# Patient Record
Sex: Female | Born: 1990 | Race: White | Hispanic: No | Marital: Single | Smoking: Never smoker
Health system: Southern US, Community
[De-identification: ages and names within clinical notes are randomized; demographics above are authoritative.]

## PROBLEM LIST (undated history)

## (undated) ENCOUNTER — Inpatient Hospital Stay (HOSPITAL_COMMUNITY): Payer: Self-pay

## (undated) DIAGNOSIS — O9A213 Injury, poisoning and certain other consequences of external causes complicating pregnancy, third trimester: Secondary | ICD-10-CM

## (undated) DIAGNOSIS — N200 Calculus of kidney: Secondary | ICD-10-CM

## (undated) HISTORY — PX: NO PAST SURGERIES: SHX2092

---
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
## 2011-05-12 LAB — URINE MICROSCOPIC-ADD ON

## 2011-05-12 LAB — URINALYSIS, ROUTINE W REFLEX MICROSCOPIC
Glucose, UA: NEGATIVE mg/dL
Ketones, ur: NEGATIVE mg/dL
Leukocytes, UA: NEGATIVE
pH: 8 (ref 5.0–8.0)

## 2011-05-12 LAB — URINE CULTURE

## 2015-05-14 LAB — OB RESULTS CONSOLE ANTIBODY SCREEN: ANTIBODY SCREEN: NEGATIVE

## 2015-05-14 LAB — OB RESULTS CONSOLE ABO/RH: RH Type: POSITIVE

## 2015-05-14 LAB — OB RESULTS CONSOLE RUBELLA ANTIBODY, IGM: RUBELLA: IMMUNE

## 2015-05-14 LAB — OB RESULTS CONSOLE HEPATITIS B SURFACE ANTIGEN: Hepatitis B Surface Ag: NEGATIVE

## 2015-05-14 LAB — OB RESULTS CONSOLE HIV ANTIBODY (ROUTINE TESTING): HIV: NONREACTIVE

## 2015-05-24 LAB — OB RESULTS CONSOLE GC/CHLAMYDIA
CHLAMYDIA, DNA PROBE: NEGATIVE
GC PROBE AMP, GENITAL: NEGATIVE

## 2015-08-08 NOTE — L&D Delivery Note (Signed)
Delivery Note At 11:14 AM a viable female was delivered via Vaginal, Spontaneous Delivery (Presentation: Left Occiput Anterior).  APGAR: 9, 9; weight  .   Placenta status: Intact, Spontaneous.  Cord: 3 vessels with the following complications: None.  Cord pH: not indicated  Anesthesia: Epidural  Episiotomy:  none Lacerations: 2nd degree, L sulcal Suture Repair: 2.0 vicryl rapide Est. Blood Loss (mL):  400  Mom to postpartum.  Baby to Couplet care / Skin to Skin.  [name] A. 12/09/2015, 11:31 AM

## 2015-09-21 ENCOUNTER — Encounter (HOSPITAL_COMMUNITY): Payer: Self-pay | Admitting: Obstetrics and Gynecology

## 2015-09-21 ENCOUNTER — Inpatient Hospital Stay (HOSPITAL_COMMUNITY): Payer: BLUE CROSS/BLUE SHIELD

## 2015-09-21 ENCOUNTER — Inpatient Hospital Stay (HOSPITAL_COMMUNITY)
Admission: AD | Admit: 2015-09-21 | Discharge: 2015-09-21 | Disposition: A | Discharge status: Home / Self Care | Payer: BLUE CROSS/BLUE SHIELD | Source: Ambulatory Visit | Attending: Obstetrics | Admitting: Obstetrics

## 2015-09-21 DIAGNOSIS — Z3A28 28 weeks gestation of pregnancy: Secondary | ICD-10-CM | POA: Insufficient documentation

## 2015-09-21 DIAGNOSIS — O9A213 Injury, poisoning and certain other consequences of external causes complicating pregnancy, third trimester: Secondary | ICD-10-CM

## 2015-09-21 DIAGNOSIS — O26893 Other specified pregnancy related conditions, third trimester: Secondary | ICD-10-CM | POA: Insufficient documentation

## 2015-09-21 DIAGNOSIS — O9A219 Injury, poisoning and certain other consequences of external causes complicating pregnancy, unspecified trimester: Secondary | ICD-10-CM

## 2015-09-21 HISTORY — DX: Injury, poisoning and certain other consequences of external causes complicating pregnancy, third trimester: O9A.213

## 2015-09-21 LAB — KLEIHAUER-BETKE STAIN
# Vials RhIg: 1
FETAL CELLS %: 0 %
QUANTITATION FETAL HEMOGLOBIN: 0 mL

## 2015-09-21 MED ORDER — LACTATED RINGERS IV BOLUS (SEPSIS): 1000.0000 mL | Freq: Once | INTRAVENOUS | Status: DC

## 2015-09-21 MED ORDER — LACTATED RINGERS IV SOLN: INTRAVENOUS | Status: DC
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
## 2015-09-21 NOTE — MAU Note (Signed)
Urine sent to Lab
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
## 2015-09-22 LAB — OB RESULTS CONSOLE RPR: RPR: NONREACTIVE

## 2015-11-16 LAB — OB RESULTS CONSOLE GBS: GBS: NEGATIVE

## 2015-12-08 ENCOUNTER — Encounter (HOSPITAL_COMMUNITY): Payer: Self-pay | Admitting: *Deleted

## 2015-12-08 ENCOUNTER — Inpatient Hospital Stay (HOSPITAL_COMMUNITY)
Admission: AD | Admit: 2015-12-08 | Discharge: 2015-12-11 | DRG: 775 | Disposition: A | Discharge status: Home / Self Care | Payer: BLUE CROSS/BLUE SHIELD | Source: Ambulatory Visit | Attending: Obstetrics | Admitting: Obstetrics

## 2015-12-08 DIAGNOSIS — Z87442 Personal history of urinary calculi: Secondary | ICD-10-CM

## 2015-12-08 DIAGNOSIS — Z3A39 39 weeks gestation of pregnancy: Secondary | ICD-10-CM | POA: Diagnosis not present

## 2015-12-08 HISTORY — DX: Calculus of kidney: N20.0

## 2015-12-08 LAB — CBC
HEMATOCRIT: 33.3 % — AB (ref 36.0–46.0)
HEMOGLOBIN: 11.7 g/dL — AB (ref 12.0–15.0)
MCH: 31.3 pg (ref 26.0–34.0)
MCHC: 35.1 g/dL (ref 30.0–36.0)
MCV: 89 fL (ref 78.0–100.0)
Platelets: 251 10*3/uL (ref 150–400)
RBC: 3.74 MIL/uL — ABNORMAL LOW (ref 3.87–5.11)
RDW: 12.6 % (ref 11.5–15.5)
WBC: 19.9 10*3/uL — ABNORMAL HIGH (ref 4.0–10.5)

## 2015-12-08 LAB — TYPE AND SCREEN
ABO/RH(D): A POS
ANTIBODY SCREEN: NEGATIVE

## 2015-12-08 MED ORDER — OXYCODONE-ACETAMINOPHEN 5-325 MG PO TABS: 2.0000 | ORAL_TABLET | ORAL | Status: DC | PRN

## 2015-12-08 MED ORDER — FLEET ENEMA 7-19 GM/118ML RE ENEM: 1.0000 | ENEMA | RECTAL | Status: DC | PRN

## 2015-12-08 MED ORDER — OXYCODONE-ACETAMINOPHEN 5-325 MG PO TABS: 1.0000 | ORAL_TABLET | ORAL | Status: DC | PRN

## 2015-12-08 MED ORDER — LACTATED RINGERS IV SOLN
INTRAVENOUS | Status: DC
  Administered 2015-12-08 – 2015-12-09 (×3): via INTRAVENOUS

## 2015-12-08 MED ORDER — ACETAMINOPHEN 325 MG PO TABS: 650.0000 mg | ORAL_TABLET | ORAL | Status: DC | PRN

## 2015-12-08 MED ORDER — OXYTOCIN BOLUS FROM INFUSION: 500.0000 mL | INTRAVENOUS | Status: DC

## 2015-12-08 MED ORDER — OXYTOCIN 10 UNIT/ML IJ SOLN
2.5000 [IU]/h | INTRAVENOUS | Status: DC
  Filled 2015-12-08: qty 4

## 2015-12-08 MED ORDER — LACTATED RINGERS IV SOLN
500.0000 mL | INTRAVENOUS | Status: DC | PRN
  Administered 2015-12-09: 500 mL via INTRAVENOUS
  Administered 2015-12-09: 1000 mL via INTRAVENOUS
  Administered 2015-12-09: 500 mL via INTRAVENOUS

## 2015-12-08 MED ORDER — ONDANSETRON HCL 4 MG/2ML IJ SOLN: 4.0000 mg | Freq: Four times a day (QID) | INTRAMUSCULAR | Status: DC | PRN

## 2015-12-08 MED ORDER — LIDOCAINE HCL (PF) 1 % IJ SOLN
30.0000 mL | INTRAMUSCULAR | Status: DC | PRN
  Filled 2015-12-08: qty 30

## 2015-12-08 MED ORDER — CITRIC ACID-SODIUM CITRATE 334-500 MG/5ML PO SOLN
30.0000 mL | ORAL | Status: DC | PRN
  Filled 2015-12-08: qty 15
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
## 2015-12-09 ENCOUNTER — Inpatient Hospital Stay (HOSPITAL_COMMUNITY): Payer: BLUE CROSS/BLUE SHIELD | Admitting: Anesthesiology

## 2015-12-09 ENCOUNTER — Encounter (HOSPITAL_COMMUNITY): Payer: Self-pay | Admitting: Anesthesiology

## 2015-12-09 LAB — ABO/RH: ABO/RH(D): A POS

## 2015-12-09 LAB — RPR: RPR Ser Ql: NONREACTIVE

## 2015-12-09 MED ORDER — ACETAMINOPHEN 325 MG PO TABS
650.0000 mg | ORAL_TABLET | ORAL | Status: DC | PRN
  Filled 2015-12-09: qty 2

## 2015-12-09 MED ORDER — GENTAMICIN SULFATE 40 MG/ML IJ SOLN
7.0000 mg/kg | Freq: Once | INTRAMUSCULAR | Status: AC
  Administered 2015-12-09: 400 mg via INTRAVENOUS
  Filled 2015-12-09: qty 10

## 2015-12-09 MED ORDER — IBUPROFEN 100 MG/5ML PO SUSP
600.0000 mg | Freq: Four times a day (QID) | ORAL | Status: DC
  Administered 2015-12-09 – 2015-12-11 (×7): 600 mg via ORAL
  Filled 2015-12-09 (×9): qty 30

## 2015-12-09 MED ORDER — TERBUTALINE SULFATE 1 MG/ML IJ SOLN
0.2500 mg | Freq: Once | INTRAMUSCULAR | Status: DC | PRN
Start: 2015-12-09 — End: 2015-12-09
  Filled 2015-12-09: qty 1

## 2015-12-09 MED ORDER — ZOLPIDEM TARTRATE 5 MG PO TABS: 5.0000 mg | ORAL_TABLET | Freq: Every evening | ORAL | Status: DC | PRN

## 2015-12-09 MED ORDER — DIPHENHYDRAMINE HCL 50 MG/ML IJ SOLN: 12.5000 mg | INTRAMUSCULAR | Status: DC | PRN

## 2015-12-09 MED ORDER — ONDANSETRON HCL 4 MG/2ML IJ SOLN: 4.0000 mg | INTRAMUSCULAR | Status: DC | PRN

## 2015-12-09 MED ORDER — PHENYLEPHRINE 40 MCG/ML (10ML) SYRINGE FOR IV PUSH (FOR BLOOD PRESSURE SUPPORT)
80.0000 ug | PREFILLED_SYRINGE | INTRAVENOUS | Status: DC | PRN
  Filled 2015-12-09: qty 5
  Filled 2015-12-09 (×2): qty 10

## 2015-12-09 MED ORDER — COCONUT OIL OIL: 1.0000 "application " | TOPICAL_OIL | Status: DC | PRN

## 2015-12-09 MED ORDER — LIDOCAINE HCL (PF) 1 % IJ SOLN
INTRAMUSCULAR | Status: DC | PRN
  Administered 2015-12-09 (×2): 4 mL via EPIDURAL

## 2015-12-09 MED ORDER — WITCH HAZEL-GLYCERIN EX PADS: 1.0000 "application " | MEDICATED_PAD | CUTANEOUS | Status: DC | PRN

## 2015-12-09 MED ORDER — PRENATAL MULTIVITAMIN CH
1.0000 | ORAL_TABLET | Freq: Every day | ORAL | Status: DC
  Filled 2015-12-09: qty 1

## 2015-12-09 MED ORDER — TETANUS-DIPHTH-ACELL PERTUSSIS 5-2.5-18.5 LF-MCG/0.5 IM SUSP: 0.5000 mL | Freq: Once | INTRAMUSCULAR | Status: DC

## 2015-12-09 MED ORDER — EPHEDRINE 5 MG/ML INJ
10.0000 mg | INTRAVENOUS | Status: DC | PRN
  Filled 2015-12-09: qty 2

## 2015-12-09 MED ORDER — DIBUCAINE 1 % RE OINT: 1.0000 "application " | TOPICAL_OINTMENT | RECTAL | Status: DC | PRN

## 2015-12-09 MED ORDER — IBUPROFEN 600 MG PO TABS
600.0000 mg | ORAL_TABLET | Freq: Four times a day (QID) | ORAL | Status: DC
  Filled 2015-12-09: qty 1

## 2015-12-09 MED ORDER — BENZOCAINE-MENTHOL 20-0.5 % EX AERO
1.0000 "application " | INHALATION_SPRAY | CUTANEOUS | Status: DC | PRN
  Filled 2015-12-09: qty 56

## 2015-12-09 MED ORDER — PHENYLEPHRINE 40 MCG/ML (10ML) SYRINGE FOR IV PUSH (FOR BLOOD PRESSURE SUPPORT)
80.0000 ug | PREFILLED_SYRINGE | INTRAVENOUS | Status: AC | PRN
  Administered 2015-12-09 (×3): 80 ug via INTRAVENOUS

## 2015-12-09 MED ORDER — LACTATED RINGERS IV SOLN
500.0000 mL | Freq: Once | INTRAVENOUS | Status: AC
  Administered 2015-12-09: 500 mL via INTRAVENOUS

## 2015-12-09 MED ORDER — DIPHENHYDRAMINE HCL 25 MG PO CAPS: 25.0000 mg | ORAL_CAPSULE | Freq: Four times a day (QID) | ORAL | Status: DC | PRN

## 2015-12-09 MED ORDER — CLINDAMYCIN PHOSPHATE 900 MG/50ML IV SOLN
900.0000 mg | Freq: Three times a day (TID) | INTRAVENOUS | Status: AC
  Administered 2015-12-09 – 2015-12-10 (×3): 900 mg via INTRAVENOUS
  Filled 2015-12-09 (×4): qty 50

## 2015-12-09 MED ORDER — FENTANYL 2.5 MCG/ML BUPIVACAINE 1/10 % EPIDURAL INFUSION (WH - ANES)
14.0000 mL/h | INTRAMUSCULAR | Status: DC | PRN
  Administered 2015-12-09 (×2): 14 mL/h via EPIDURAL
  Filled 2015-12-09 (×2): qty 125

## 2015-12-09 MED ORDER — OXYTOCIN 10 UNIT/ML IJ SOLN
1.0000 m[IU]/min | INTRAVENOUS | Status: DC
  Administered 2015-12-09: 2 m[IU]/min via INTRAVENOUS

## 2015-12-09 MED ORDER — OXYTOCIN 10 UNIT/ML IJ SOLN: 1.0000 m[IU]/min | INTRAVENOUS | Status: DC

## 2015-12-09 MED ORDER — ONDANSETRON HCL 4 MG PO TABS: 4.0000 mg | ORAL_TABLET | ORAL | Status: DC | PRN

## 2015-12-09 MED ORDER — SENNOSIDES-DOCUSATE SODIUM 8.6-50 MG PO TABS
2.0000 | ORAL_TABLET | ORAL | Status: DC
  Filled 2015-12-09 (×2): qty 2

## 2015-12-09 MED ORDER — SIMETHICONE 80 MG PO CHEW
80.0000 mg | CHEWABLE_TABLET | ORAL | Status: DC | PRN
Start: 2015-12-09 — End: 2015-12-11

## 2015-12-09 NOTE — Anesthesia Preprocedure Evaluation (Signed)
Anesthesia Evaluation  Patient identified by MRN, date of birth, ID band Patient awake    Reviewed: Allergy & Precautions, NPO status , Patient's Chart, lab work & pertinent test results  Airway Mallampati: II  TM Distance: >3 FB Neck ROM: Full    Dental no notable dental hx. (+) Teeth Intact   Pulmonary neg pulmonary ROS,    Pulmonary exam normal breath sounds clear to auscultation       Cardiovascular negative cardio ROS Normal cardiovascular exam Rhythm:Regular Rate:Normal     Neuro/Psych negative neurological ROS  negative psych ROS   GI/Hepatic   Endo/Other  negative endocrine ROS  Renal/GU Renal diseaseHx/o renal calculi  negative genitourinary   Musculoskeletal negative musculoskeletal ROS (+)   Abdominal   Peds  Hematology  (+) anemia ,   Anesthesia Other Findings   Reproductive/Obstetrics (+) Pregnancy                             Anesthesia Physical Anesthesia Plan  ASA: II  Anesthesia Plan: Epidural   Post-op Pain Management:    Induction:   Airway Management Planned: Natural Airway  Additional Equipment:   Intra-op Plan:   Post-operative Plan:   Informed Consent: I have reviewed the patients History and Physical, chart, labs and discussed the procedure including the risks, benefits and alternatives for the proposed anesthesia with the patient or authorized representative who has indicated his/her understanding and acceptance.     Plan Discussed with: Anesthesiologist  Anesthesia Plan Comments:         Anesthesia Quick Evaluation

## 2015-12-09 NOTE — Lactation Note (Addendum)
This note was copied from a baby's chart. Lactation Consultation Note  Patient Name: Felicia Wood WGNFA'OToday's Date: 12/09/2015 Reason for consult: Initial assessment Baby at 7 hr of life and RN requested help because baby was not latching. Mom has short shaft nipples with firm breast. Mom does have creases in the center of each nipple that FOB reports have always been there. Applied #16 NS and baby was able to go on well after 2 attempts. After 10 minutes mom reported pain. Instructed mom on how to take baby off the breast. Colostrum was noted in the NS. Discussed baby behavior, feeding frequency, baby belly size, voids, wt loss, breast changes, and nipple care. Demonstrated manual expression, colostrum noted bilaterally, spoon in room. Given lactation handouts. Aware of OP services and support group. Mom will offer the breast on demand 8+/24 hr and use the [place] for 5-10 minutes after feeding using the NS. She will try to latch baby without the NS but will continue to use it as needed. She will offer any milk she might get pumping back to the baby with the spoon.        Maternal Data Formula Feeding for Exclusion: No Has patient been taught Hand Expression?: Yes Does the patient have breastfeeding experience prior to this delivery?: No  Feeding Feeding Type: Breast Fed Length of feed: 10 min  LATCH Score/Interventions Latch: Repeated attempts needed to sustain latch, nipple held in mouth throughout feeding, stimulation needed to elicit sucking reflex. Intervention(s): Adjust position;Assist with latch  Audible Swallowing: Spontaneous and intermittent Intervention(s): Hand expression;Skin to skin  Type of Nipple: Everted at rest and after stimulation  Comfort (Breast/Nipple): Soft / non-tender     Hold (Positioning): Full assist, staff holds infant at breast Intervention(s): Support Pillows;Position options  LATCH Score: 7  Lactation Tools Discussed/Used Tools: Nipple  Shields Nipple shield size: 16 WIC Program: No Pump Review: Setup, frequency, and cleaning;Milk Storage Initiated by:: ES Date initiated:: 12/09/15   Consult Status Consult Status: Follow-up Date: 12/10/15 Follow-up type: In-patient    [name] 12/09/2015, 6:29 PM

## 2015-12-09 NOTE — Anesthesia Procedure Notes (Signed)
Epidural Patient location during procedure: OB Start time: 12/09/2015 12:57 AM  Staffing Anesthesiologist: Mal AmabileFOSTER, Felder Lebeda Performed by: anesthesiologist   Preanesthetic Checklist Completed: patient identified, site marked, surgical consent, pre-op evaluation, timeout performed, IV checked, risks and benefits discussed and monitors and equipment checked  Epidural Patient position: sitting Prep: site prepped and draped and DuraPrep Patient monitoring: continuous pulse ox and blood pressure Approach: midline Location: L3-L4 Injection technique: LOR air  Needle:  Needle type: Tuohy  Needle gauge: 17 G Needle length: 9 cm and 9 Needle insertion depth: 5 cm cm Catheter type: closed end flexible Catheter size: 19 Gauge Catheter at skin depth: 10 cm Test dose: negative and Other  Assessment Events: blood not aspirated, injection not painful, no injection resistance, negative IV test and no paresthesia  Additional Notes Patient identified. Risks and benefits discussed including failed block, incomplete  Pain control, post dural puncture headache, nerve damage, paralysis, blood pressure Changes, nausea, vomiting, reactions to medications-both toxic and allergic and post Partum back pain. All questions were answered. Patient expressed understanding and wished to proceed. Sterile technique was used throughout procedure. Epidural site was Dressed with sterile barrier dressing. No paresthesias, signs of intravascular injection Or signs of intrathecal spread were encountered.  Patient was more comfortable after the epidural was dosed. Please see RN's note for documentation of vital signs and FHR which are stable.

## 2015-12-09 NOTE — Anesthesia Postprocedure Evaluation (Signed)
Anesthesia Post Note  Patient: Felicia Wood  Procedure(s) Performed: * No procedures listed *  Patient location during evaluation: Mother Baby Anesthesia Type: Epidural Level of consciousness: awake Pain management: pain level controlled Vital Signs Assessment: post-procedure vital signs reviewed and stable Respiratory status: spontaneous breathing Cardiovascular status: stable Postop Assessment: no headache, no backache, epidural receding, patient able to bend at knees, no signs of nausea or vomiting and adequate PO intake Anesthetic complications: no     Last Vitals:  Filed Vitals:   12/09/15 1301 12/09/15 1330  BP: 116/79   Pulse: 92   Temp:  37.9 C  Resp: 18     Last Pain:  Filed Vitals:   12/09/15 1348  PainSc: 0-No pain   Pain Goal: Patients Stated Pain Goal: 6 (12/09/15 0244)               Fanny DanceMULLINS,Layliana Devins

## 2015-12-09 NOTE — Progress Notes (Signed)
S: Doing well, no complaints, pain well controlled with epidural  O: BP 107/52 mmHg  Pulse 67  Temp(Src) 98.3 F (36.8 C) (Oral)  Resp 16  Ht 5\' 2"  (1.575 m)  Wt 68.04 kg (150 lb)  BMI 27.43 kg/m2  SpO2 100%   FHT:  FHR: 145s bpm, variability: moderate,  accelerations:  Present,  decelerations:  Present occasional episodes of prolonged 4-6 min decels to 90s UC:   irregular, every 7 minutes SVE:   Dilation: 5 Effacement (%): 80 Station: 0 Exam by:: Dr. Ernestina PennaFogleman   A / P:  24 y.o.  Obstetric History   G2   P0   T0   P0   A1   TAB0   SAB1   E0   M0   L0    at 38107w3d spontaneous labor, admitted in latent phase due to decel in MAU. slow progress Start pitocin  Fetal Wellbeing:  Category I Pain Control:  Epidural  Anticipated MOD:  NSVD  Felicia Wood A. 12/09/2015, 5:31 AM

## 2015-12-10 NOTE — Lactation Note (Signed)
This note was copied from a baby's chart. Lactation Consultation Note  Patient Name: [name] WUJWJ'XToday's Date: 12/10/2015 Reason for consult: Follow-up assessment  Baby 22 hours old. Mom reports sore nipples and left nipple is cracked. Mom given comfort gels with instructions. Discussed with mom that nursing should not hurt. Enc mom to call for LC to assist with latching baby in football position to achieve a deeper latch. Discussed positioning with pillows and different nursing positions as well. Reviewed normal progression of milk coming to volume, and discussed OP/BFSG and LC phone line assistance after D/C.  Maternal Data    Feeding    LATCH Score/Interventions                      Lactation Tools Discussed/Used Tools: Nipple Shields;Comfort gels Nipple shield size: 16   Consult Status Consult Status: Follow-up Date: 12/11/15 Follow-up type: In-patient    [name], Detta Mellin 12/10/2015, 10:05 AM

## 2015-12-10 NOTE — Lactation Note (Signed)
This note was copied from a baby's chart. Lactation Consultation Note  Patient Name: Felicia Wood ONGEX'BToday's Date: 12/10/2015 Reason for consult: Follow-up assessment  Baby 28 hours old. Mom called out for assistance with latching baby due to mom's left nipple bleeding last night, and both nipples sore. Baby's tongue does not move freely--restricted lift, lateralization and extension over gumline. Enc mom to discuss baby's oral anatomy with pediatrician if nipple pain continues especially after mom's milk comes to volume. Also enc tummy time. Mom aware of OP/BFSG and LC assistance after D/C. Assisted mom to latch baby in cross-cradle position to right breast, but mom states that latch hurts. Refitted mom with #20 NS and mom reported increased comfort. Baby able to maintain latch and nurse for 30 minutes with colostrum present in NS. However, mom reports seeing a spot of blood at right nipple when baby finished nursing. Discussed with mom that NS a temporary device, and discussed methods of moving away from its use. Discussed the need to make sure baby transferring milk through shield, watching baby's weight gain carefully, and providing extra stimulation of breasts with DEBP. Mom has a DEBP at home.  Brought pumping supplies into room and asked patient's bedside nurse, Patty, RN to assist mom with the set up. Enc mom to use comfort gels and discussed with mom and Patty, RN that mom can rest her nipples if too painful to nurse and pump instead. Enc parents to ask for assistance with giving the baby EBM as needed. Discussed assessment and interventions with evening LC, Jana, RN, IBCLC. Maternal Data    Feeding Feeding Type: Breast Fed Length of feed: 30 min  LATCH Score/Interventions Latch: Grasps breast easily, tongue down, lips flanged, rhythmical sucking.  Audible Swallowing: A few with stimulation Intervention(s): Skin to skin;Hand expression  Type of Nipple: Everted at rest and after  stimulation  Comfort (Breast/Nipple): Filling, red/small blisters or bruises, mild/mod discomfort  Problem noted: Mild/Moderate discomfort  Hold (Positioning): No assistance needed to correctly position infant at breast.  LATCH Score: 8  Lactation Tools Discussed/Used Tools: Nipple Shields Nipple shield size: 20   Consult Status Consult Status: Follow-up Date: 12/11/15 Follow-up type: In-patient    [name], [name] 12/10/2015, 4:31 PM

## 2015-12-10 NOTE — Progress Notes (Signed)
Patient ID: Harrietta GuardianKristy L Kapusta, female   DOB: 12-25-1990, 25 y.o.   MRN: [id number] PPD # 1 SVD  S:  Reports feeling well.             Tolerating po/ No nausea or vomiting             Bleeding is light             Pain controlled with ibuprofen (OTC)             Up ad lib / ambulatory / voiding without difficulties    Newborn  Information for the patient's newborn:  Ilda Bassetlexander, Girl Summer [[id number]][[id number]]  female  breast feeding    O:  A & O x 3, in no apparent distress              VS:  Filed Vitals:   12/09/15 1425 12/09/15 1530 12/09/15 2001 12/10/15 0642  BP: 109/53 104/46 107/47 108/55  Pulse: 95 101 84 85  Temp: 101.2 F (38.4 C) 98.2 F (36.8 C) 98.1 F (36.7 C) 98.4 F (36.9 C)  TempSrc: Oral Oral Oral Oral  Resp: 18 18 18 18   Height:      Weight:      SpO2: 96% 98% 98%     LABS:  Recent Labs  12/08/15 2235  WBC 19.9*  HGB 11.7*  HCT 33.3*  PLT 251    Blood type: A POS (05/03 2235)  Rubella: Immune (10/07 0000)   I&O: I/O last 3 completed shifts: In: -  Out: 3850 [Urine:3450; Blood:400]             Lungs: Clear and unlabored  Heart: regular rate and rhythm / no murmurs  Abdomen: soft, non-tender, non-distended             Fundus: firm, non-tender, U-1  Perineum: 2nd degree repair healing well, no edema  Lochia: minimal  Extremities: No edema, no calf pain or tenderness, No Homans    A/P: PPD # 1  24 y.o., [id number]   Principal Problem:   Postpartum care following vaginal delivery (5/4) Active Problems:   Normal labor   Doing well - stable status  Routine post partum orders  Anticipate discharge tomorrow    Raelyn MoraAWSON, Saul Dorsi, M, MSN, CNM 12/10/2015, 2:31 PM

## 2015-12-11 MED ORDER — IBUPROFEN 100 MG/5ML PO SUSP: 600.0000 mg | Freq: Four times a day (QID) | ORAL | Status: AC

## 2015-12-11 NOTE — Lactation Note (Signed)
This note was copied from a baby's chart. Lactation Consultation Note  Patient Name: Felicia Wood UJWJX'BToday's Date: 12/11/2015 Reason for consult: Follow-up assessment  Baby 46 hours old. Mom feeding baby EBM with bottle when this LC entered room. Mom reports that baby has taken 15 ml. Baby cueing to nurse, so enc mom to give remaining EBM. Mom reports that she nursed baby through the night using NS and she is not quite as sore as she was yesterday. Discussed with mom that she will need to continue to use DEBP as long as she is using NS in order to protect her breast milk supply. Discussed methods of moving away from NS use, and the need to watch baby's weight gain carefully. Enc mom to discuss baby's oral anatomy with pediatrician if BF continues to be painful after her milk comes to volume. Mom reports that she has been thinking that she might decide to pump and bottle-feed EBM. Discussed the benefits of BF and EBM. Mom has personal DEBP. Mom aware of OP/BFSG and LC phone line assistance after D/C.  Plan is for mom to continue to nurse with cues and at least every 3 hours--using NS as needed, and then supplement with EBM/formula as needed. Enc mom to post-pump after each feeding for 15 minutes followed by hand expression. Referred mom to Baby and Me booklet for EBM storage guidelines and number of diapers to expect by day of life.  Maternal Data    Feeding Feeding Type: Breast Milk Length of feed: 20 min  LATCH Score/Interventions                      Lactation Tools Discussed/Used     Consult Status Consult Status: PRN    Felicia Wood, Felicia Wood 12/11/2015, 10:00 AM

## 2015-12-11 NOTE — Discharge Summary (Signed)
OB Discharge Summary     Patient Name: Felicia Wood DOB: [birth year] MRN: [id number]  Date of admission: 12/08/2015 Delivering MD: Noland Fordyce   Date of discharge: 12/11/2015  Admitting diagnosis: 39 wks Labor Intrauterine pregnancy: [redacted]w[redacted]d     Secondary diagnosis:  Principal Problem:   Postpartum care following vaginal delivery (5/4) Active Problems:   Normal labor  Additional problems: none     Discharge diagnosis: Term Pregnancy Delivered                                                                                                Post partum procedures:none  Augmentation: AROM and Pitocin  Complications: None  Hospital course:  Onset of Labor With Vaginal Delivery     25 y.o. yo G2P1011 at [redacted]w[redacted]d was admitted in Latent Labor on 12/08/2015. Patient had an uncomplicated labor course as follows:  Membrane Rupture Time/Date: 10:50 AM ,12/09/2015   Intrapartum Procedures: Episiotomy:                                           Lacerations:  2nd degree [3]  Patient had a delivery of a Viable infant. 12/09/2015  Information for the patient's newborn:  Shella Spearing [[id number]]  Delivery Method: Vaginal, Spontaneous Delivery (Filed from Delivery Summary)    Pateint had an uncomplicated postpartum course.  She is ambulating, tolerating a regular diet, passing flatus, and urinating well. Patient is discharged home in stable condition on 12/13/2015.    Physical exam  Filed Vitals:   12/09/15 2001 12/10/15 0642 12/10/15 1812 12/11/15 0613  BP: 107/47 108/55 96/82 110/45  Pulse: 84 85 90 64  Temp: 98.1 F (36.7 C) 98.4 F (36.9 C) 97.4 F (36.3 C) 97.6 F (36.4 C)  TempSrc: Oral Oral Oral Oral  Resp: Height:      Weight:      SpO2: 98%      General: alert, cooperative and no distress Lochia: appropriate Uterine Fundus: firm DVT Evaluation: No evidence of DVT seen on physical exam. Negative Homan's sign. No cords or calf tenderness. No  significant calf/ankle edema. Labs: Lab Results  Component Value Date   WBC 19.9* 12/08/2015   HGB 11.7* 12/08/2015   HCT 33.3* 12/08/2015   MCV 89.0 12/08/2015   PLT 251 12/08/2015   No flowsheet data found.  Discharge instruction: per After Visit Summary and "Baby and Me Booklet".  After visit meds:    Medication List    TAKE these medications        flintstones complete 60 MG chewable tablet  Chew 2 tablets by mouth daily.     FOLIC ACID PO  Take 2 tablets by mouth daily.     ibuprofen 100 MG/5ML suspension  Commonly known as:  ADVIL,MOTRIN  Take 30 mLs (600 mg total) by mouth every 6 (six) hours.        Diet: routine diet  Activity: Advance as tolerated. Pelvic rest for 6  weeks.   Outpatient follow up:6 weeks Follow up Appt:No future appointments. Follow up Visit:No Follow-up on file.  Postpartum contraception: Undecided  Newborn Data: Live born female on 12/09/2015 Birth Weight: 5 lb 13.7 oz (2656 g) APGAR: 9, 9  Baby Feeding: Breast Disposition:home with mother   12/11/2015 Raelyn MoraAWSON, Yurika Pereda, Judie PetitM, CNM

## 2015-12-11 NOTE — Progress Notes (Signed)
Patient ID: Felicia Wood, female   DOB: 09-Sep-1990, 25 y.o.   MRN: [id number] PPD # 2 SVD  S:  Reports feeling well. Ready for D/C home.             Tolerating po/ No nausea or vomiting             Bleeding is light             Pain controlled with ibuprofen (OTC)             Up ad lib / ambulatory / voiding without difficulties    Newborn  Information for the patient's newborn:  Felicia Wood, Felicia Wood [[id number]][[id number]]  female   breast feeding    O:  A & O x 3, in no apparent distress              VS:  Filed Vitals:   12/09/15 2001 12/10/15 0642 12/10/15 1812 12/11/15 0613  BP: 107/47 108/55 96/82 110/45  Pulse: 84 85 90 64  Temp: 98.1 F (36.7 C) 98.4 F (36.9 C) 97.4 F (36.3 C) 97.6 F (36.4 C)  TempSrc: Oral Oral Oral Oral  Resp: 18 18 18 18   Height:      Weight:      SpO2: 98%       LABS:   Recent Labs  12/08/15 2235  WBC 19.9*  HGB 11.7*  HCT 33.3*  PLT 251    Blood type: A POS (05/03 2235)  Rubella: Immune (10/07 0000)     Lungs: Clear and unlabored  Heart: regular rate and rhythm / no murmurs  Abdomen: soft, non-tender, non-distended             Fundus: firm, non-tender, U-2  Perineum: 2nd degree repair healing well, no edema  Lochia: scant  Extremities: No edema, no calf pain or tenderness, No Homans    A/P: PPD # 2  25 y.o., [id number]   Principal Problem:    Postpartum care following vaginal delivery (5/4)    Doing well - stable status  Routine post partum orders  D/C home today    Raelyn MoraAWSON, Tanairy Payeur, M, MSN, CNM 12/11/2015, 9:41 AM

## 2015-12-11 NOTE — Discharge Instructions (Signed)
Breast Pumping Tips °If you are breastfeeding, there may be times when you cannot feed your baby directly. Returning to work or going on a trip are common examples. Pumping allows you to store breast milk and feed it to your baby later.  °You may not get much milk when you first start to pump. Your breasts should start to make more after a few days. If you pump at the times you usually feed your baby, you may be able to keep making enough milk to feed your baby without also using formula. The more often you pump, the more milk you will produce.  °WHEN SHOULD I PUMP?  °· You can begin to pump soon after delivery. However, some experts recommend waiting about 4 weeks before giving your infant a bottle to make sure breastfeeding is going well.  °· If you plan to return to work, begin pumping a few weeks before. This will help you develop techniques that work best for you. It also lets you build up a supply of breast milk.   °· When you are with your infant, feed on demand and pump after each feeding.   °· When you are away from your infant for several hours, pump for about 15 minutes every 2-3 hours. Pump both breasts at the same time if you can.   °· If your infant has a formula feeding, make sure to pump around the same time.     °· If you drink any alcohol, wait 2 hours before pumping.   °HOW DO I PREPARE TO PUMP? °Your let-down reflex is the natural reaction to stimulation that makes your breast milk flow. It is easier to stimulate this reflex when you are relaxed. Find relaxation techniques that work for you. If you have difficulty with your let-down reflex, try these methods:  °· Smell one of your infant's blankets or an item of clothing.   °· Look at a picture or video of your infant.   °· Sit in a quiet, private space.   °· Massage the breast you plan to pump.   °· Place soothing warmth on the breast.   °· Play relaxing music.   °WHAT ARE SOME GENERAL BREAST PUMPING TIPS? °· Wash your hands before you pump. You  do not need to wash your nipples or breasts. °· There are three ways to pump. °¨ You can use your hand to massage and compress your breast. °¨ You can use a handheld manual pump. °¨ You can use an electric pump.   °· Make sure the suction cup (flange) on the breast pump is the right size. Place the flange directly over the nipple. If it is the wrong size or placed the wrong way, it may be painful and cause nipple damage.   °· If pumping is uncomfortable, apply a small amount of purified or modified lanolin to your nipple and areola. °· If you are using an electric pump, adjust the speed and suction power to be more comfortable. °· If pumping is painful or if you are not getting very much milk, you may need a different type of pump. A lactation consultant can help you determine what type of pump to use.   °· Keep a full water bottle near you at all times. Drinking lots of fluid helps you make more milk.  °· You can store your milk to use later. Pumped breast milk can be stored in a sealable, sterile container or plastic bag. Label all stored breast milk with the date you pumped it. °¨ Milk can stay out at room temperature for up to 8 hours. °¨   You can store your milk in the refrigerator for up to 8 days. °¨ You can store your milk in the freezer for 3 months. Thaw frozen milk using warm water. Do not put it in the microwave. °· Do not smoke. Smoking can lower your milk supply and harm your infant. If you need help quitting, ask your health care provider to recommend a program.   °WHEN SHOULD I CALL MY HEALTH CARE PROVIDER OR A LACTATION CONSULTANT? °· You are having trouble pumping. °· You are concerned that you are not making enough milk. °· You have nipple pain, soreness, or redness. °· You want to use birth control. Birth control pills may lower your milk supply. Talk to your health care provider about your options. °  °This information is not intended to replace advice given to you by your health care provider.  Make sure you discuss any questions you have with your health care provider. °  °Document Released: 01/11/2010 Document Revised: 07/29/2013 Document Reviewed: 05/16/2013 °[employer]vier Interactive Patient Education ©2016 [employer] °Postpartum Depression and Baby Blues °The postpartum period begins right after the birth of a baby. During this time, there is often a great amount of joy and excitement. It is also a time of many changes in the life of the parents. Regardless of how many times a mother gives birth, each child brings new challenges and dynamics to the family. It is not unusual to have feelings of excitement along with confusing shifts in moods, emotions, and thoughts. All mothers are at risk of developing postpartum depression or the "baby blues." These mood changes can occur right after giving birth, or they may occur many months after giving birth. The baby blues or postpartum depression can be mild or severe. Additionally, postpartum depression can go away rather quickly, or it can be a long-term condition.  °CAUSES °Raised hormone levels and the rapid drop in those levels are thought to be a main cause of postpartum depression and the baby blues. A number of hormones change during and after pregnancy. Estrogen and progesterone usually decrease right after the delivery of your baby. The levels of thyroid hormone and various cortisol steroids also rapidly drop. Other factors that play a role in these mood changes include major life events and genetics.  °RISK FACTORS °If you have any of the following risks for the baby blues or postpartum depression, know what symptoms to watch out for during the postpartum period. Risk factors that may increase the likelihood of getting the baby blues or postpartum depression include: °· Having a personal or family history of depression.   °· Having depression while being pregnant.   °· Having premenstrual mood issues or mood issues related to oral  contraceptives. °· Having a lot of life stress.   °· Having marital conflict.   °· Lacking a social support network.   °· Having a baby with special needs.   °· Having health problems, such as diabetes.   °SIGNS AND SYMPTOMS °Symptoms of baby blues include: °· Brief changes in mood, such as going from extreme happiness to sadness. °· Decreased concentration.   °· Difficulty sleeping.   °· Crying spells, tearfulness.   °· Irritability.   °· Anxiety.   °Symptoms of postpartum depression typically begin within the first month after giving birth. These symptoms include: °· Difficulty sleeping or excessive sleepiness.   °· Marked weight loss.   °· Agitation.   °· Feelings of worthlessness.   °· Lack of interest in activity or food.   °Postpartum psychosis is a very serious condition and can be dangerous. Fortunately, it is   rare. Displaying any of the following symptoms is cause for immediate medical attention. Symptoms of postpartum psychosis include:  °· Hallucinations and delusions.   °· Bizarre or disorganized behavior.   °· Confusion or disorientation.   °DIAGNOSIS  °A diagnosis is made by an evaluation of your symptoms. There are no medical or lab tests that lead to a diagnosis, but there are various questionnaires that a health care provider may use to identify those with the baby blues, postpartum depression, or psychosis. Often, a screening tool called the Edinburgh Postnatal Depression Scale is used to diagnose depression in the postpartum period.  °TREATMENT °The baby blues usually goes away on its own in 1-2 weeks. Social support is often all that is needed. You will be encouraged to get adequate sleep and rest. Occasionally, you may be given medicines to help you sleep.  °Postpartum depression requires treatment because it can last several months or longer if it is not treated. Treatment may include individual or group therapy, medicine, or both to address any social, physiological, and psychological factors  that may play a role in the depression. Regular exercise, a healthy diet, rest, and social support may also be strongly recommended.  °Postpartum psychosis is more serious and needs treatment right away. Hospitalization is often needed. °HOME CARE INSTRUCTIONS °· Get as much rest as you can. Nap when the baby sleeps.   °· Exercise regularly. Some women find yoga and walking to be beneficial.   °· Eat a balanced and nourishing diet.   °· Do little things that you enjoy. Have a cup of tea, take a bubble bath, read your favorite magazine, or listen to your favorite music. °· Avoid alcohol.   °· Ask for help with household chores, cooking, grocery shopping, or running errands as needed. Do not try to do everything.   °· Talk to people close to you about how you are feeling. Get support from your partner, family members, friends, or other new moms. °· Try to stay positive in how you think. Think about the things you are grateful for.   °· Do not spend a lot of time alone.   °· Only take over-the-counter or prescription medicine as directed by your health care provider. °· Keep all your postpartum appointments.   °· Let your health care provider know if you have any concerns.   °SEEK MEDICAL CARE IF: °You are having a reaction to or problems with your medicine. °SEEK IMMEDIATE MEDICAL CARE IF: °· You have suicidal feelings.   °· You think you may harm the baby or someone else. °MAKE SURE YOU: °· Understand these instructions. °· Will watch your condition. °· Will get help right away if you are not doing well or get worse. °  °This information is not intended to replace advice given to you by your health care provider. Make sure you discuss any questions you have with your health care provider. °  °Document Released: 04/27/2004 Document Revised: 07/29/2013 Document Reviewed: 05/05/2013 °Elsevier Interactive Patient Education ©2016 [employer] °Postpartum Care After Vaginal Delivery °After you deliver your newborn  (postpartum period), the usual stay in the hospital is 24-72 hours. If there were problems with your labor or delivery, or if you have other medical problems, you might be in the hospital longer.  °While you are in the hospital, you will receive help and instructions on how to care for yourself and your newborn during the postpartum period.  °While you are in the hospital: °· Be sure to tell your nurses if you have pain or discomfort, as well as   where you feel the pain and what makes the pain worse. °· If you had an incision made near your vagina (episiotomy) or if you had some tearing during delivery, the nurses may put ice packs on your episiotomy or tear. The ice packs may help to reduce the pain and swelling. °· If you are breastfeeding, you may feel uncomfortable contractions of your uterus for a couple of weeks. This is normal. The contractions help your uterus get back to normal size. °· It is normal to have some bleeding after delivery. °¨ For the first 1-3 days after delivery, the flow is red and the amount may be similar to a period. °¨ It is common for the flow to start and stop. °¨ In the first few days, you may pass some small clots. Let your nurses know if you begin to pass large clots or your flow increases. °¨ Do not  flush blood clots down the toilet before having the nurse look at them. °¨ During the next 3-10 days after delivery, your flow should become more watery and pink or brown-tinged in color. °¨ Ten to fourteen days after delivery, your flow should be a small amount of yellowish-white discharge. °¨ The amount of your flow will decrease over the first few weeks after delivery. Your flow may stop in 6-8 weeks. Most women have had their flow stop by 12 weeks after delivery. °· You should change your sanitary pads frequently. °· Wash your hands thoroughly with soap and water for at least 20 seconds after changing pads, using the toilet, or before holding or feeding your newborn. °· You should  feel like you need to empty your bladder within the first 6-8 hours after delivery. °· In case you become weak, lightheaded, or faint, call your nurse before you get out of bed for the first time and before you take a shower for the first time. °· Within the first few days after delivery, your breasts may begin to feel tender and full. This is called engorgement. Breast tenderness usually goes away within 48-72 hours after engorgement occurs. You may also notice milk leaking from your breasts. If you are not breastfeeding, do not stimulate your breasts. Breast stimulation can make your breasts produce more milk. °· Spending as much time as possible with your newborn is very important. During this time, you and your newborn can feel close and get to know each other. Having your newborn stay in your room (rooming in) will help to strengthen the bond with your newborn.  It will give you time to get to know your newborn and become comfortable caring for your newborn. °· Your hormones change after delivery. Sometimes the hormone changes can temporarily cause you to feel sad or tearful. These feelings should not last more than a few days. If these feelings last longer than that, you should talk to your caregiver. °· If desired, talk to your caregiver about methods of family planning or contraception. °· Talk to your caregiver about immunizations. Your caregiver may want you to have the following immunizations before leaving the hospital: °¨ Tetanus, diphtheria, and pertussis (Tdap) or tetanus and diphtheria (Td) immunization. It is very important that you and your family (including grandparents) or others caring for your newborn are up-to-date with the Tdap or Td immunizations. The Tdap or Td immunization can help protect your newborn from getting ill. °¨ Rubella immunization. °¨ Varicella (chickenpox) immunization. °¨ Influenza immunization. You should receive this annual immunization if you did not receive the    immunization during your pregnancy. °  °This information is not intended to replace advice given to you by your health care provider. Make sure you discuss any questions you have with your health care provider. °  °Document Released: 05/21/2007 Document Revised: 04/17/2012 Document Reviewed: 03/20/2012 °[employer]vier Interactive Patient Education ©2016 [employer] °Breastfeeding and Mastitis °Mastitis is inflammation of the breast tissue. It can occur in women who are breastfeeding. This can make breastfeeding painful. Mastitis will sometimes go away on its own. Your health care provider will help determine if treatment is needed. °CAUSES °Mastitis is often associated with a blocked milk (lactiferous) duct. This can happen when too much milk builds up in the breast. Causes of excess milk in the breast can include: °· Poor latch-on. If your baby is not latched onto the breast properly, she or he may not empty your breast completely while breastfeeding. °· Allowing too much time to pass between feedings. °· Wearing a bra or other clothing that is too tight. This puts extra pressure on the lactiferous ducts so milk does not flow through them as it should. °Mastitis can also be caused by a bacterial infection. Bacteria may enter the breast tissue through cuts or openings in the skin. In women who are breastfeeding, this may occur because of cracked or irritated skin. Cracks in the skin are often caused when your baby does not latch on properly to the breast. °SIGNS AND SYMPTOMS °· Swelling, redness, tenderness, and pain in an area of the breast. °· Swelling of the glands under the arm on the same side. °· Fever may or may not accompany mastitis. °If an infection is allowed to progress, a collection of pus (abscess) may develop. °DIAGNOSIS  °Your health care provider can usually diagnose mastitis based on your symptoms and a physical exam. Tests may be done to help confirm the diagnosis. These may include: °· Removal of pus  from the breast by applying pressure to the area. This pus can be examined in the lab to determine which bacteria are present. If an abscess has developed, the fluid in the abscess can be removed with a needle. This can also be used to confirm the diagnosis and determine the bacteria present. In most cases, pus will not be present. °· Blood tests to determine if your body is fighting a bacterial infection. °· Mammogram or ultrasound tests to rule out other problems or diseases. °TREATMENT  °Mastitis that occurs with breastfeeding will sometimes go away on its own. Your health care provider may choose to wait 24 hours after first seeing you to decide whether a prescription medicine is needed. If your symptoms are worse after 24 hours, your health care provider will likely prescribe an antibiotic medicine to treat the mastitis. He or she will determine which bacteria are most likely causing the infection and will then select an appropriate antibiotic medicine. This is sometimes changed based on the results of tests performed to identify the bacteria, or if there is no response to the antibiotic medicine selected. Antibiotic medicines are usually given by mouth. You may also be given medicine for pain. °HOME CARE INSTRUCTIONS °· Only take over-the-counter or prescription medicines for pain, fever, or discomfort as directed by your health care provider. °· If your health care provider prescribed an antibiotic medicine, take the medicine as directed. Make sure you finish it even if you start to feel better. °· Do not wear a tight or underwire bra. Wear a soft, supportive bra. °· Increase your fluid   intake, especially if you have a fever. °· Continue to empty the breast. Your health care provider can tell you whether this milk is safe for your infant or needs to be thrown out. You may be told to stop nursing until your health care provider thinks it is safe for your baby. Use a breast pump if you are advised to stop  nursing. °· Keep your nipples clean and dry. °· Empty the first breast completely before going to the other breast. If your baby is not emptying your breasts completely for some reason, use a breast pump to empty your breasts. °· If you go back to work, pump your breasts while at work to stay in time with your nursing schedule. °· Avoid allowing your breasts to become overly filled with milk (engorged). °SEEK MEDICAL CARE IF: °· You have pus-like discharge from the breast. °· Your symptoms do not improve with the treatment prescribed by your health care provider within 2 days. °SEEK IMMEDIATE MEDICAL CARE IF: °· Your pain and swelling are getting worse. °· You have pain that is not controlled with medicine. °· You have a red line extending from the breast toward your armpit. °· You have a fever or persistent symptoms for more than 2-3 days. °· You have a fever and your symptoms suddenly get worse. °MAKE SURE YOU:  °· Understand these instructions. °· Will watch your condition. °· Will get help right away if you are not doing well or get worse. °  °This information is not intended to replace advice given to you by your health care provider. Make sure you discuss any questions you have with your health care provider. °  °Document Released: 11/18/2004 Document Revised: 07/29/2013 Document Reviewed: 02/27/2013 °[employer]vier Interactive Patient Education ©2016 [employer] ° °Breastfeeding °Deciding to breastfeed is one of the best choices you can make for you and your baby. A change in hormones during pregnancy causes your breast tissue to grow and increases the number and size of your milk ducts. These hormones also allow proteins, sugars, and fats from your blood supply to make breast milk in your milk-producing glands. Hormones prevent breast milk from being released before your baby is born as well as prompt milk flow after birth. Once breastfeeding has begun, thoughts of your baby, as well as his or her sucking or  crying, can stimulate the release of milk from your milk-producing glands.  °BENEFITS OF BREASTFEEDING °For Your Baby °· Your first milk (colostrum) helps your baby's digestive system function better. °· There are antibodies in your milk that help your baby fight off infections. °· Your baby has a lower incidence of asthma, allergies, and sudden infant death syndrome. °· The nutrients in breast milk are better for your baby than infant formulas and are designed uniquely for your baby's needs. °· Breast milk improves your baby's brain development. °· Your baby is less likely to develop other conditions, such as childhood obesity, asthma, or type 2 diabetes mellitus. °For You °· Breastfeeding helps to create a very special bond between you and your baby. °· Breastfeeding is convenient. Breast milk is always available at the correct temperature and costs nothing. °· Breastfeeding helps to burn calories and helps you lose the weight gained during pregnancy. °· Breastfeeding makes your uterus contract to its prepregnancy size faster and slows bleeding (lochia) after you give birth.   °· Breastfeeding helps to lower your risk of developing type 2 diabetes mellitus, osteoporosis, and breast or ovarian cancer later in life. °  SIGNS THAT YOUR BABY IS HUNGRY °Early Signs of Hunger °· Increased alertness or activity. °· Stretching. °· Movement of the head from side to side. °· Movement of the head and opening of the mouth when the corner of the mouth or cheek is stroked (rooting). °· Increased sucking sounds, smacking lips, cooing, sighing, or squeaking. °· Hand-to-mouth movements. °· Increased sucking of fingers or hands. °Late Signs of Hunger °· Fussing. °· Intermittent crying. °Extreme Signs of Hunger °Signs of extreme hunger will require calming and consoling before your baby will be able to breastfeed successfully. Do not wait for the following signs of extreme hunger to occur before you initiate  breastfeeding: °· Restlessness. °· A loud, strong cry. °· Screaming. °BREASTFEEDING BASICS °Breastfeeding Initiation °· Find a comfortable place to sit or lie down, with your neck and back well supported. °· Place a pillow or rolled up blanket under your baby to bring him or her to the level of your breast (if you are seated). Nursing pillows are specially designed to help support your arms and your baby while you breastfeed. °· Make sure that your baby's abdomen is facing your abdomen. °· Gently massage your breast. With your fingertips, massage from your chest wall toward your nipple in a circular motion. This encourages milk flow. You may need to continue this action during the feeding if your milk flows slowly. °· Support your breast with 4 fingers underneath and your thumb above your nipple. Make sure your fingers are well away from your nipple and your baby's mouth. °· Stroke your baby's lips gently with your finger or nipple. °· When your baby's mouth is open wide enough, quickly bring your baby to your breast, placing your entire nipple and as much of the colored area around your nipple (areola) as possible into your baby's mouth. °¨ More areola should be visible above your baby's upper lip than below the lower lip. °¨ Your baby's tongue should be between his or her lower gum and your breast. °· Ensure that your baby's mouth is correctly positioned around your nipple (latched). Your baby's lips should create a seal on your breast and be turned out (everted). °· It is common for your baby to suck about 2-3 minutes in order to start the flow of breast milk. °Latching °Teaching your baby how to latch on to your breast properly is very important. An improper latch can cause nipple pain and decreased milk supply for you and poor weight gain in your baby. Also, if your baby is not latched onto your nipple properly, he or she may swallow some air during feeding. This can make your baby fussy. Burping your baby when  you switch breasts during the feeding can help to get rid of the air. However, teaching your baby to latch on properly is still the best way to prevent fussiness from swallowing air while breastfeeding. °Signs that your baby has successfully latched on to your nipple: °· Silent tugging or silent sucking, without causing you pain. °· Swallowing heard between every 3-4 sucks. °· Muscle movement above and in front of his or her ears while sucking. °Signs that your baby has not successfully latched on to nipple: °· Sucking sounds or smacking sounds from your baby while breastfeeding. °· Nipple pain. °If you think your baby has not latched on correctly, slip your finger into the corner of your baby's mouth to break the suction and place it between your baby's gums. Attempt breastfeeding initiation again. °Signs of Successful Breastfeeding °  Signs from your baby: °· A gradual decrease in the number of sucks or complete cessation of sucking. °· Falling asleep. °· Relaxation of his or her body. °· Retention of a small amount of milk in his or her mouth. °· Letting go of your breast by himself or herself. °Signs from you: °· Breasts that have increased in firmness, weight, and size 1-3 hours after feeding. °· Breasts that are softer immediately after breastfeeding. °· Increased milk volume, as well as a change in milk consistency and color by the fifth day of breastfeeding. °· Nipples that are not sore, cracked, or bleeding. °Signs That Your Baby is Getting Enough Milk °· Wetting at least 3 diapers in a 24-hour period. The urine should be clear and pale yellow by age 5 days. °· At least 3 stools in a 24-hour period by age 5 days. The stool should be soft and yellow. °· At least 3 stools in a 24-hour period by age 7 days. The stool should be seedy and yellow. °· No loss of weight greater than 10% of birth weight during the first 3 days of age. °· Average weight gain of 4-7 ounces (113-198 g) per week after age 4  days. °· Consistent daily weight gain by age 5 days, without weight loss after the age of 2 weeks. °After a feeding, your baby may spit up a small amount. This is common. °BREASTFEEDING FREQUENCY AND DURATION °Frequent feeding will help you make more milk and can prevent sore nipples and breast engorgement. Breastfeed when you feel the need to reduce the fullness of your breasts or when your baby shows signs of hunger. This is called "breastfeeding on demand." Avoid introducing a pacifier to your baby while you are working to establish breastfeeding (the first 4-6 weeks after your baby is born). After this time you may choose to use a pacifier. Research has shown that pacifier use during the first year of a baby's life decreases the risk of sudden infant death syndrome (SIDS). °Allow your baby to feed on each breast as long as he or she wants. Breastfeed until your baby is finished feeding. When your baby unlatches or falls asleep while feeding from the first breast, offer the second breast. Because newborns are often sleepy in the first few weeks of life, you may need to awaken your baby to get him or her to feed. °Breastfeeding times will vary from baby to baby. However, the following rules can serve as a guide to help you ensure that your baby is properly fed: °· Newborns (babies 4 weeks of age or younger) may breastfeed every 1-3 hours. °· Newborns should not go longer than 3 hours during the day or 5 hours during the night without breastfeeding. °· You should breastfeed your baby a minimum of 8 times in a 24-hour period until you begin to introduce solid foods to your baby at around 6 months of age. °BREAST MILK PUMPING °Pumping and storing breast milk allows you to ensure that your baby is exclusively fed your breast milk, even at times when you are unable to breastfeed. This is especially important if you are going back to work while you are still breastfeeding or when you are not able to be present during  feedings. Your lactation consultant can give you guidelines on how long it is safe to store breast milk. °A breast pump is a machine that allows you to pump milk from your breast into a sterile bottle. The pumped breast milk can   then be stored in a refrigerator or freezer. Some breast pumps are operated by hand, while others use electricity. Ask your lactation consultant which type will work best for you. Breast pumps can be purchased, but some hospitals and breastfeeding support groups lease breast pumps on a monthly basis. A lactation consultant can teach you how to hand express breast milk, if you prefer not to use a pump. °CARING FOR YOUR BREASTS WHILE YOU BREASTFEED °Nipples can become dry, cracked, and sore while breastfeeding. The following recommendations can help keep your breasts moisturized and healthy: °· Avoid using soap on your nipples. °· Wear a supportive bra. Although not required, special nursing bras and tank tops are designed to allow access to your breasts for breastfeeding without taking off your entire bra or top. Avoid wearing underwire-style bras or extremely tight bras. °· Air dry your nipples for 3-4 minutes after each feeding. °· Use only cotton bra pads to absorb leaked breast milk. Leaking of breast milk between feedings is normal. °· Use lanolin on your nipples after breastfeeding. Lanolin helps to maintain your skin's normal moisture barrier. If you use pure lanolin, you do not need to wash it off before feeding your baby again. Pure lanolin is not toxic to your baby. You may also hand express a few drops of breast milk and gently massage that milk into your nipples and allow the milk to air dry. °In the first few weeks after giving birth, some women experience extremely full breasts (engorgement). Engorgement can make your breasts feel heavy, warm, and tender to the touch. Engorgement peaks within 3-5 days after you give birth. The following recommendations can help ease  engorgement: °· Completely empty your breasts while breastfeeding or pumping. You may want to start by applying warm, moist heat (in the shower or with warm water-soaked hand towels) just before feeding or pumping. This increases circulation and helps the milk flow. If your baby does not completely empty your breasts while breastfeeding, pump any extra milk after he or she is finished. °· Wear a snug bra (nursing or regular) or tank top for 1-2 days to signal your body to slightly decrease milk production. °· Apply ice packs to your breasts, unless this is too uncomfortable for you. °· Make sure that your baby is latched on and positioned properly while breastfeeding. °If engorgement persists after 48 hours of following these recommendations, contact your health care provider or a lactation consultant. °OVERALL HEALTH CARE RECOMMENDATIONS WHILE BREASTFEEDING °· Eat healthy foods. Alternate between meals and snacks, eating 3 of each per day. Because what you eat affects your breast milk, some of the foods may make your baby more irritable than usual. Avoid eating these foods if you are sure that they are negatively affecting your baby. °· Drink milk, fruit juice, and water to satisfy your thirst (about 10 glasses a day). °· Rest often, relax, and continue to take your prenatal vitamins to prevent fatigue, stress, and anemia. °· Continue breast self-awareness checks. °· Avoid chewing and smoking tobacco. Chemicals from cigarettes that pass into breast milk and exposure to secondhand smoke may harm your baby. °· Avoid alcohol and drug use, including marijuana. °Some medicines that may be harmful to your baby can pass through breast milk. It is important to ask your health care provider before taking any medicine, including all over-the-counter and prescription medicine as well as vitamin and herbal supplements. °It is possible to become pregnant while breastfeeding. If birth control is desired, ask your health care    provider about options that will be safe for your baby. °SEEK MEDICAL CARE IF: °· You feel like you want to stop breastfeeding or have become frustrated with breastfeeding. °· You have painful breasts or nipples. °· Your nipples are cracked or bleeding. °· Your breasts are red, tender, or warm. °· You have a swollen area on either breast. °· You have a fever or chills. °· You have nausea or vomiting. °· You have drainage other than breast milk from your nipples. °· Your breasts do not become full before feedings by the fifth day after you give birth. °· You feel sad and depressed. °· Your baby is too sleepy to eat well. °· Your baby is having trouble sleeping.   °· Your baby is wetting less than 3 diapers in a 24-hour period. °· Your baby has less than 3 stools in a 24-hour period. °· Your baby's skin or the white part of his or her eyes becomes yellow.   °· Your baby is not gaining weight by 5 days of age. °SEEK IMMEDIATE MEDICAL CARE IF: °· Your baby is overly tired (lethargic) and does not want to wake up and feed. °· Your baby develops an unexplained fever. °  °This information is not intended to replace advice given to you by your health care provider. Make sure you discuss any questions you have with your health care provider. °  °Document Released: 07/24/2005 Document Revised: 04/14/2015 Document Reviewed: 01/15/2013 °[employer] Interactive Patient Education ©2016 [employer] ° °
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
